# Patient Record
Sex: Female | Born: 1990 | Race: Black or African American | Hispanic: No | Marital: Single | Smoking: Never smoker
Health system: Southern US, Community
[De-identification: ages and names within clinical notes are randomized; demographics above are authoritative.]

## PROBLEM LIST (undated history)

## (undated) HISTORY — PX: IUD REMOVAL: SHX5392

---
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
## 2010-05-03 ENCOUNTER — Emergency Department (HOSPITAL_COMMUNITY)
Admission: EM | Admit: 2010-05-03 | Discharge: 2010-05-03 | Payer: Self-pay | Source: Home / Self Care | Admitting: Family Medicine

## 2010-08-01 ENCOUNTER — Ambulatory Visit: Payer: No Typology Code available for payment source | Attending: Internal Medicine | Admitting: Physical Therapy

## 2010-08-08 ENCOUNTER — Ambulatory Visit: Payer: BC Managed Care – PPO | Attending: Internal Medicine | Admitting: Physical Therapy

## 2010-08-08 DIAGNOSIS — M546 Pain in thoracic spine: Secondary | ICD-10-CM | POA: Insufficient documentation

## 2010-08-08 DIAGNOSIS — R5381 Other malaise: Secondary | ICD-10-CM | POA: Insufficient documentation

## 2010-08-08 DIAGNOSIS — IMO0001 Reserved for inherently not codable concepts without codable children: Secondary | ICD-10-CM | POA: Insufficient documentation

## 2010-08-08 DIAGNOSIS — M545 Low back pain, unspecified: Secondary | ICD-10-CM | POA: Insufficient documentation

## 2010-08-08 DIAGNOSIS — M542 Cervicalgia: Secondary | ICD-10-CM | POA: Insufficient documentation

## 2010-08-10 ENCOUNTER — Ambulatory Visit: Payer: BC Managed Care – PPO | Admitting: Physical Therapy

## 2010-08-16 ENCOUNTER — Ambulatory Visit: Payer: BC Managed Care – PPO | Admitting: Physical Therapy

## 2010-08-18 ENCOUNTER — Ambulatory Visit: Payer: BC Managed Care – PPO | Admitting: Physical Therapy

## 2010-08-19 ENCOUNTER — Ambulatory Visit: Payer: BC Managed Care – PPO | Admitting: Physical Therapy

## 2010-08-22 ENCOUNTER — Ambulatory Visit: Payer: BC Managed Care – PPO | Admitting: Physical Therapy

## 2010-08-24 ENCOUNTER — Ambulatory Visit: Payer: BC Managed Care – PPO | Admitting: Physical Therapy

## 2010-08-26 ENCOUNTER — Ambulatory Visit: Payer: BC Managed Care – PPO | Admitting: Physical Therapy

## 2010-08-29 ENCOUNTER — Ambulatory Visit: Payer: BC Managed Care – PPO | Admitting: Physical Therapy

## 2010-08-31 ENCOUNTER — Ambulatory Visit: Payer: BC Managed Care – PPO | Admitting: Physical Therapy

## 2010-09-02 ENCOUNTER — Ambulatory Visit: Payer: BC Managed Care – PPO | Admitting: Physical Therapy

## 2010-09-06 ENCOUNTER — Ambulatory Visit: Payer: No Typology Code available for payment source | Attending: Internal Medicine | Admitting: Physical Therapy

## 2010-09-06 DIAGNOSIS — M545 Low back pain, unspecified: Secondary | ICD-10-CM | POA: Insufficient documentation

## 2010-09-06 DIAGNOSIS — IMO0001 Reserved for inherently not codable concepts without codable children: Secondary | ICD-10-CM | POA: Insufficient documentation

## 2010-09-06 DIAGNOSIS — R5381 Other malaise: Secondary | ICD-10-CM | POA: Insufficient documentation

## 2010-09-06 DIAGNOSIS — M542 Cervicalgia: Secondary | ICD-10-CM | POA: Insufficient documentation

## 2010-09-06 DIAGNOSIS — M546 Pain in thoracic spine: Secondary | ICD-10-CM | POA: Insufficient documentation

## 2010-09-08 ENCOUNTER — Ambulatory Visit: Payer: No Typology Code available for payment source | Admitting: Rehabilitation

## 2010-09-12 ENCOUNTER — Ambulatory Visit: Payer: No Typology Code available for payment source | Admitting: Rehabilitation

## 2010-09-15 ENCOUNTER — Ambulatory Visit: Payer: No Typology Code available for payment source | Admitting: Rehabilitation

## 2010-09-20 ENCOUNTER — Ambulatory Visit: Payer: No Typology Code available for payment source | Admitting: Physical Therapy

## 2010-09-23 ENCOUNTER — Ambulatory Visit: Payer: No Typology Code available for payment source | Admitting: Rehabilitation

## 2010-09-27 ENCOUNTER — Ambulatory Visit: Payer: No Typology Code available for payment source | Admitting: Rehabilitation

## 2010-09-28 ENCOUNTER — Ambulatory Visit: Payer: No Typology Code available for payment source | Admitting: Rehabilitation

## 2010-10-07 ENCOUNTER — Ambulatory Visit: Payer: No Typology Code available for payment source | Admitting: Physical Therapy

## 2010-10-10 ENCOUNTER — Ambulatory Visit: Payer: No Typology Code available for payment source | Attending: Internal Medicine | Admitting: Rehabilitation

## 2010-10-10 DIAGNOSIS — IMO0001 Reserved for inherently not codable concepts without codable children: Secondary | ICD-10-CM | POA: Insufficient documentation

## 2010-10-10 DIAGNOSIS — M542 Cervicalgia: Secondary | ICD-10-CM | POA: Insufficient documentation

## 2010-10-10 DIAGNOSIS — R5381 Other malaise: Secondary | ICD-10-CM | POA: Insufficient documentation

## 2010-10-10 DIAGNOSIS — M545 Low back pain, unspecified: Secondary | ICD-10-CM | POA: Insufficient documentation

## 2010-10-10 DIAGNOSIS — M546 Pain in thoracic spine: Secondary | ICD-10-CM | POA: Insufficient documentation

## 2010-11-02 ENCOUNTER — Ambulatory Visit: Payer: No Typology Code available for payment source | Admitting: Physical Therapy

## 2010-11-07 ENCOUNTER — Ambulatory Visit: Payer: No Typology Code available for payment source | Attending: Internal Medicine | Admitting: Physical Therapy

## 2010-11-07 DIAGNOSIS — IMO0001 Reserved for inherently not codable concepts without codable children: Secondary | ICD-10-CM | POA: Insufficient documentation

## 2010-11-07 DIAGNOSIS — R293 Abnormal posture: Secondary | ICD-10-CM | POA: Insufficient documentation

## 2010-11-07 DIAGNOSIS — M2550 Pain in unspecified joint: Secondary | ICD-10-CM | POA: Insufficient documentation

## 2010-11-07 DIAGNOSIS — M2569 Stiffness of other specified joint, not elsewhere classified: Secondary | ICD-10-CM | POA: Insufficient documentation

## 2010-11-14 ENCOUNTER — Ambulatory Visit: Payer: No Typology Code available for payment source | Admitting: Physical Therapy

## 2010-11-16 ENCOUNTER — Ambulatory Visit: Payer: No Typology Code available for payment source | Admitting: Rehabilitation

## 2010-11-23 ENCOUNTER — Ambulatory Visit: Payer: No Typology Code available for payment source | Admitting: Rehabilitation

## 2010-12-08 ENCOUNTER — Ambulatory Visit: Payer: No Typology Code available for payment source | Attending: Internal Medicine | Admitting: Physical Therapy

## 2010-12-08 DIAGNOSIS — IMO0001 Reserved for inherently not codable concepts without codable children: Secondary | ICD-10-CM | POA: Insufficient documentation

## 2010-12-08 DIAGNOSIS — M545 Low back pain, unspecified: Secondary | ICD-10-CM | POA: Insufficient documentation

## 2010-12-08 DIAGNOSIS — M542 Cervicalgia: Secondary | ICD-10-CM | POA: Insufficient documentation

## 2010-12-08 DIAGNOSIS — R5381 Other malaise: Secondary | ICD-10-CM | POA: Insufficient documentation

## 2010-12-08 DIAGNOSIS — M546 Pain in thoracic spine: Secondary | ICD-10-CM | POA: Insufficient documentation

## 2011-02-05 ENCOUNTER — Emergency Department (HOSPITAL_BASED_OUTPATIENT_CLINIC_OR_DEPARTMENT_OTHER)
Admission: EM | Admit: 2011-02-05 | Discharge: 2011-02-05 | Disposition: A | Discharge status: Other Acute Inpatient Hospital | Payer: Medicaid Other | Attending: Emergency Medicine | Admitting: Emergency Medicine

## 2011-02-05 DIAGNOSIS — N939 Abnormal uterine and vaginal bleeding, unspecified: Secondary | ICD-10-CM

## 2011-02-05 DIAGNOSIS — N898 Other specified noninflammatory disorders of vagina: Secondary | ICD-10-CM | POA: Insufficient documentation

## 2011-02-05 DIAGNOSIS — R109 Unspecified abdominal pain: Secondary | ICD-10-CM | POA: Insufficient documentation

## 2011-02-05 DIAGNOSIS — Z3201 Encounter for pregnancy test, result positive: Secondary | ICD-10-CM | POA: Insufficient documentation

## 2011-02-05 LAB — URINALYSIS, ROUTINE W REFLEX MICROSCOPIC
Ketones, ur: NEGATIVE mg/dL
Leukocytes, UA: NEGATIVE
Nitrite: NEGATIVE
Protein, ur: NEGATIVE mg/dL

## 2011-02-05 LAB — CBC
MCH: 28 pg (ref 26.0–34.0)
MCV: 83.9 fL (ref 78.0–100.0)
Platelets: 205 10*3/uL (ref 150–400)
RDW: 13.1 % (ref 11.5–15.5)

## 2011-02-05 LAB — URINE MICROSCOPIC-ADD ON

## 2011-02-05 LAB — ABO/RH: ABO/RH(D): O POS

## 2011-02-05 LAB — HCG, QUANTITATIVE, PREGNANCY: hCG, Beta Chain, Quant, S: 2212 m[IU]/mL — ABNORMAL HIGH (ref <5)

## 2011-02-05 LAB — PREGNANCY, URINE: Preg Test, Ur: POSITIVE

## 2011-02-05 NOTE — ED Provider Notes (Signed)
History     CSN: [id number] Arrival date & time: 02/05/2011  3:03 AM  Chief Complaint  Patient presents with  . Abdominal Cramping  . Vaginal Bleeding    (Consider location/radiation/quality/duration/timing/severity/associated sxs/prior treatment) Patient is a 20 y.o. female presenting with cramps and vaginal bleeding. The history is provided by the patient.  Abdominal Cramping The primary symptoms of the illness include abdominal pain and vaginal bleeding. The primary symptoms of the illness do not include nausea, vomiting, diarrhea or vaginal discharge. Episode onset: today.  Vaginal Bleeding Associated symptoms include abdominal pain.   is reports development of heavy vaginal bleeding.  She denies fever or chills.  She denies dysuria. G3P2A1.  She reports that her pregnancy test was positive.  Nothing improves or worsens her symptoms.  She has had no lightheadedness or chest pain or shortness of breath.  She's had no syncope.  She denies melena and hematochezia  History reviewed. No pertinent past medical history.  Past Surgical History  Procedure Date  . Cesarean section   . Iud removal     History reviewed. No pertinent family history.  History  Substance Use Topics  . Smoking status: Never Smoker   . Smokeless tobacco: Never Used  . Alcohol Use: Yes     occasional use, last used 02/03/2011    OB History    Grav Para Term Preterm Abortions TAB SAB Ect Mult Living                  Review of Systems  Gastrointestinal: Positive for abdominal pain. Negative for nausea, vomiting and diarrhea.  Genitourinary: Positive for vaginal bleeding. Negative for vaginal discharge.  All other systems reviewed and are negative.    Allergies  Review of patient's allergies indicates no known allergies.  Home Medications  No current outpatient prescriptions on file.  BP 106/58  Pulse 73  Temp(Src) 98.4 F (36.9 C) (Oral)  Resp 16  Ht 5\' 6"  (1.676 m)  Wt 170 lb (77.111 kg)   BMI 27.44 kg/m2  SpO2 100%  LMP 01/05/2011  Physical Exam  Nursing note and vitals reviewed. Constitutional: She is oriented to person, place, and time. She appears well-developed and well-nourished. No distress.  HENT:  Head: Normocephalic and atraumatic.  Eyes: EOM are normal.  Neck: Normal range of motion.  Cardiovascular: Normal rate, regular rhythm and normal heart sounds.   Pulmonary/Chest: Effort normal and breath sounds normal.  Abdominal: Soft. She exhibits no distension. There is no tenderness.  Genitourinary:       Normal appearing cervix with a small amount of blood coming from it.  No clots in the vagina.  No adnexal masses or tenderness.  External genitalia are normal  Musculoskeletal: Normal range of motion.  Neurological: She is alert and oriented to person, place, and time.  Skin: Skin is warm and dry.  Psychiatric: She has a normal mood and affect. Judgment normal.    ED Course  Procedures (including critical care time)  Labs Reviewed  URINALYSIS, ROUTINE W REFLEX MICROSCOPIC - Abnormal; Notable for the following:    Hgb urine dipstick SMALL (*)    All other components within normal limits  WET PREP, GENITAL - Abnormal; Notable for the following:    Clue Cells, Wet Prep FEW (*)    WBC, Wet Prep HPF POC MODERATE (*)    All other components within normal limits  HCG, QUANTITATIVE, PREGNANCY - Abnormal; Notable for the following:    hCG, Beta Chain, Quant, S 2212 (*)  All other components within normal limits  PREGNANCY, URINE  URINE MICROSCOPIC-ADD ON  CBC  GC/CHLAMYDIA PROBE AMP, GENITAL  ABO/RH   No results found.   1. Abdominal pain   2. Pregnancy test-positive   3. Vaginal bleeding       MDM  3:39 AM Obtain urine and urine pregnancy.  Pelvic exam to be completed.  Patient with normal vital signs and well appearing.  Last normal menstrual period 01/05/2011  4:16 AM Urine pregnancy test is positive.  We'll obtain Rh and quantitative  hCG  5:24 AM Attempted transvaginal ultrasound at bedside.  Unable to locate a gestational sac or intrauterine pregnancy.  Concern for possible mass in the right adnexa.  Patient will require formal ultrasound.  I spoke with Dr. Rana Snare from [facility] who accepts the patient in transfer to the [facility] emergency department for official ultrasound. Spoke with the ER physician who will expect the pt        Lyanne Co, MD 02/05/11 [phone]

## 2011-02-05 NOTE — ED Notes (Signed)
MD at bedside performing pelvic exam.

## 2011-02-05 NOTE — ED Notes (Signed)
Pt states that she had onset of abdominal cramping and vaginal bleeding about 45 minutes prior to her arrival here.  Pt states that she believes that she may be pregnant.  LMP 8/30

## 2011-02-05 NOTE — ED Notes (Signed)
I placed a call to [facility] for the MD on call for Dr. Blanch Media, call returned Dr. Rana Snare.

## 2011-07-29 ENCOUNTER — Encounter (HOSPITAL_BASED_OUTPATIENT_CLINIC_OR_DEPARTMENT_OTHER): Payer: Self-pay | Admitting: *Deleted

## 2011-07-29 ENCOUNTER — Emergency Department (HOSPITAL_BASED_OUTPATIENT_CLINIC_OR_DEPARTMENT_OTHER)
Admission: EM | Admit: 2011-07-29 | Discharge: 2011-07-29 | Disposition: A | Discharge status: Other Acute Inpatient Hospital | Payer: Medicaid Other | Attending: Emergency Medicine | Admitting: Emergency Medicine

## 2011-07-29 DIAGNOSIS — O26899 Other specified pregnancy related conditions, unspecified trimester: Secondary | ICD-10-CM

## 2011-07-29 DIAGNOSIS — O209 Hemorrhage in early pregnancy, unspecified: Secondary | ICD-10-CM | POA: Insufficient documentation

## 2011-07-29 DIAGNOSIS — B9689 Other specified bacterial agents as the cause of diseases classified elsewhere: Secondary | ICD-10-CM | POA: Insufficient documentation

## 2011-07-29 DIAGNOSIS — A499 Bacterial infection, unspecified: Secondary | ICD-10-CM | POA: Insufficient documentation

## 2011-07-29 DIAGNOSIS — N760 Acute vaginitis: Secondary | ICD-10-CM | POA: Insufficient documentation

## 2011-07-29 DIAGNOSIS — O99891 Other specified diseases and conditions complicating pregnancy: Secondary | ICD-10-CM | POA: Insufficient documentation

## 2011-07-29 DIAGNOSIS — O2390 Unspecified genitourinary tract infection in pregnancy, unspecified trimester: Secondary | ICD-10-CM | POA: Insufficient documentation

## 2011-07-29 DIAGNOSIS — R109 Unspecified abdominal pain: Secondary | ICD-10-CM | POA: Insufficient documentation

## 2011-07-29 LAB — URINALYSIS, ROUTINE W REFLEX MICROSCOPIC
Bilirubin Urine: NEGATIVE
Ketones, ur: NEGATIVE mg/dL
Nitrite: NEGATIVE
pH: 6 (ref 5.0–8.0)

## 2011-07-29 LAB — BASIC METABOLIC PANEL
BUN: 10 mg/dL (ref 6–23)
CO2: 26 mEq/L (ref 19–32)
Calcium: 9.6 mg/dL (ref 8.4–10.5)
Creatinine, Ser: 0.8 mg/dL (ref 0.50–1.10)
Glucose, Bld: 82 mg/dL (ref 70–99)

## 2011-07-29 LAB — CBC
HCT: 41.8 % (ref 36.0–46.0)
MCH: 28 pg (ref 26.0–34.0)
MCV: 82.3 fL (ref 78.0–100.0)
Platelets: 225 10*3/uL (ref 150–400)
RBC: 5.08 MIL/uL (ref 3.87–5.11)

## 2011-07-29 LAB — WET PREP, GENITAL
Trich, Wet Prep: NONE SEEN
Yeast Wet Prep HPF POC: NONE SEEN

## 2011-07-29 LAB — RH IG WORKUP (INCLUDES ABO/RH): Gestational Age(Wks): 5

## 2011-07-29 MED ORDER — METRONIDAZOLE 500 MG PO TABS
2000.0000 mg | ORAL_TABLET | Freq: Once | ORAL | Status: AC
  Administered 2011-07-29: 2000 mg via ORAL
  Filled 2011-07-29: qty 4

## 2011-07-29 NOTE — ED Notes (Signed)
I placed a call for consult to [facility], the call was returned by Dr. Altamese [place].

## 2011-07-29 NOTE — ED Notes (Signed)
Pt transferred to [facility] by [facility] for Ultrasound- alert- denies pain-

## 2011-07-29 NOTE — ED Notes (Signed)
Pt states she is approx 5-[redacted] weeks pregnant, but has not seen the Dr. Today noticed some spotting and abd cramping. Pain is worse on the left "like my left ovary is hurting"

## 2011-07-29 NOTE — ED Notes (Signed)
Patient ambulates to bathroom without difficulty and with no assistance

## 2011-07-29 NOTE — ED Provider Notes (Signed)
History     CSN: [id number]  Arrival date & time 07/29/11  1653   First MD Initiated Contact with Patient 07/29/11 1721      Chief Complaint  Patient presents with  . Abdominal Pain    (Consider location/radiation/quality/duration/timing/severity/associated sxs/prior treatment) HPI Comments: Pt states that she noticed bright red blood with wiping throughout the day today:pt states that her lmp was feb11:pt is g5 para4(twins):2 elective abortions:pt has not seen her obgyn yet:pt states that she is having left sided pain  Patient is a 21 y.o. female presenting with abdominal pain. The history is provided by the patient. No language interpreter was used.  Abdominal Pain The primary symptoms of the illness include abdominal pain and vaginal bleeding. The primary symptoms of the illness do not include fever, nausea, vomiting or dysuria. The current episode started 6 to 12 hours ago. The problem has not changed since onset. The patient states that she believes she is currently pregnant. Symptoms associated with the illness do not include back pain.    History reviewed. No pertinent past medical history.  Past Surgical History  Procedure Date  . Cesarean section   . Iud removal     History reviewed. No pertinent family history.  History  Substance Use Topics  . Smoking status: Never Smoker   . Smokeless tobacco: Never Used  . Alcohol Use: Yes     occasional use, last used 02/03/2011    OB History    Grav Para Term Preterm Abortions TAB SAB Ect Mult Living   6 3   2            Review of Systems  Constitutional: Negative.  Negative for fever.  Respiratory: Negative.   Cardiovascular: Negative.   Gastrointestinal: Positive for abdominal pain. Negative for nausea and vomiting.  Genitourinary: Positive for vaginal bleeding. Negative for dysuria.  Musculoskeletal: Negative for back pain.  Neurological: Negative.   Psychiatric/Behavioral: Negative.     Allergies  Review of  patient's allergies indicates no known allergies.  Home Medications  No current outpatient prescriptions on file.  BP 98/59  Pulse 82  Temp(Src) 98.2 F (36.8 C) (Oral)  Resp 18  Ht 5\' 6"  (1.676 m)  Wt 175 lb (79.379 kg)  BMI 28.25 kg/m2  SpO2 100%  LMP 06/19/2011  Breastfeeding? Unknown  Physical Exam  Nursing note and vitals reviewed. Constitutional: She is oriented to person, place, and time. She appears well-developed and well-nourished.  HENT:  Head: Normocephalic and atraumatic.  Eyes: Conjunctivae and EOM are normal.  Neck: Neck supple.  Cardiovascular: Normal rate and regular rhythm.   Pulmonary/Chest: Effort normal and breath sounds normal.  Abdominal: Soft. Bowel sounds are normal. There is no tenderness.  Genitourinary: Cervix exhibits no motion tenderness. Right adnexum displays no tenderness. Left adnexum displays no tenderness.       Brown vaginal discharge without any bleeding  Musculoskeletal: Normal range of motion.  Neurological: She is alert and oriented to person, place, and time.  Skin: Skin is warm and dry.  Psychiatric: She has a normal mood and affect.    ED Course  Procedures (including critical care time)  Labs Reviewed  PREGNANCY, URINE - Abnormal; Notable for the following:    Preg Test, Ur POSITIVE (*)    All other components within normal limits  HCG, QUANTITATIVE, PREGNANCY - Abnormal; Notable for the following:    hCG, Beta Chain, Sharene Butters, [place] [address] (*)    All other components within normal limits  WET PREP,  GENITAL - Abnormal; Notable for the following:    Clue Cells Wet Prep HPF POC TOO NUMEROUS TO COUNT (*)    WBC, Wet Prep HPF POC TOO NUMEROUS TO COUNT (*)    All other components within normal limits  URINALYSIS, ROUTINE W REFLEX MICROSCOPIC  CBC  BASIC METABOLIC PANEL  RH IG WORKUP  GC/CHLAMYDIA PROBE AMP, GENITAL   No results found.   1. Abdominal pain in pregnancy   2. BV (bacterial vaginosis)       MDM  Spoke with  Dr. Altamese [place] with ob and pt to go to [facility] for ultrasound to rule out ectopic:type and rh obtained although not yet known:pt to be transferred shortly        Teressa Lower, NP 07/29/11 1927  Teressa Lower, NP 07/29/11 1952

## 2011-07-30 NOTE — ED Provider Notes (Signed)
Medical screening examination/treatment/procedure(s) were performed by non-physician practitioner and as supervising physician I was immediately available for consultation/collaboration.  Toy Baker, MD 07/30/11 [phone]

## 2011-07-31 LAB — GC/CHLAMYDIA PROBE AMP, GENITAL
Chlamydia, DNA Probe: NEGATIVE
GC Probe Amp, Genital: NEGATIVE

## 2012-01-06 ENCOUNTER — Encounter (HOSPITAL_BASED_OUTPATIENT_CLINIC_OR_DEPARTMENT_OTHER): Payer: Self-pay | Admitting: Emergency Medicine

## 2012-01-06 ENCOUNTER — Emergency Department (HOSPITAL_BASED_OUTPATIENT_CLINIC_OR_DEPARTMENT_OTHER)
Admission: EM | Admit: 2012-01-06 | Discharge: 2012-01-06 | Disposition: A | Discharge status: Home / Self Care | Payer: BC Managed Care – PPO | Attending: Emergency Medicine | Admitting: Emergency Medicine

## 2012-01-06 DIAGNOSIS — J020 Streptococcal pharyngitis: Secondary | ICD-10-CM | POA: Insufficient documentation

## 2012-01-06 MED ORDER — IBUPROFEN 800 MG PO TABS
800.0000 mg | ORAL_TABLET | Freq: Once | ORAL | Status: AC
  Administered 2012-01-06: 800 mg via ORAL

## 2012-01-06 MED ORDER — PENICILLIN V POTASSIUM 500 MG PO TABS: 500.0000 mg | ORAL_TABLET | Freq: Three times a day (TID) | ORAL | Status: AC

## 2012-01-06 MED ORDER — IBUPROFEN 400 MG PO TABS
ORAL_TABLET | ORAL | Status: AC
  Administered 2012-01-06: 800 mg via ORAL
  Filled 2012-01-06: qty 2

## 2012-01-06 NOTE — ED Notes (Signed)
Pt c/o sore throat and fever since yesterday. 

## 2012-01-06 NOTE — ED Provider Notes (Addendum)
History     CSN: [id number]  Arrival date & time 01/06/12  8295   First MD Initiated Contact with Patient 01/06/12 1004      Chief Complaint  Patient presents with  . Sore Throat    (Consider location/radiation/quality/duration/timing/severity/associated sxs/prior treatment) HPI Patient complaining of sore throat that began yesterday. She has also had fever. She describes diffuse muscle aches. She has not noted any rash. She took Tylenol last night with some relief of symptoms. Her brother has recently had strep throat. She has not noted any discharge from her throat. She has been taking by mouth well. She does not have any cough, nausea, vomiting, or diarrhea.  No past medical history on file.  Past Surgical History  Procedure Date  . Cesarean section   . Iud removal     No family history on file.  History  Substance Use Topics  . Smoking status: Never Smoker   . Smokeless tobacco: Never Used  . Alcohol Use: Yes     occasional use, last used 02/03/2011    OB History    Grav Para Term Preterm Abortions TAB SAB Ect Mult Living   6 3   2            Review of Systems  Constitutional: Negative for fever, chills, activity change, appetite change and unexpected weight change.  HENT: Negative for sore throat, rhinorrhea, neck pain, neck stiffness and sinus pressure.   Eyes: Negative for visual disturbance.  Respiratory: Negative for cough and shortness of breath.   Cardiovascular: Negative for chest pain and leg swelling.  Gastrointestinal: Negative for vomiting, abdominal pain, diarrhea and blood in stool.  Genitourinary: Negative for dysuria, urgency, frequency, vaginal discharge and difficulty urinating.  Musculoskeletal: Negative for myalgias, arthralgias and gait problem.  Skin: Negative for color change and rash.  Neurological: Negative for weakness, light-headedness and headaches.  Hematological: Does not bruise/bleed easily.  Psychiatric/Behavioral: Negative for  dysphoric mood.    Allergies  Review of patient's allergies indicates no known allergies.  Home Medications   Current Outpatient Rx  Name Route Sig Dispense Refill  . SPRINTEC 28 PO Oral Take by mouth daily.      BP 93/59  Pulse 125  Temp 101.3 F (38.5 C) (Oral)  Resp 22  SpO2 100%  LMP 12/19/2011  Breastfeeding? No  Physical Exam  Nursing note and vitals reviewed. Constitutional: She is oriented to person, place, and time. She appears well-developed and well-nourished.  HENT:  Head: Normocephalic and atraumatic.  Right Ear: External ear normal.  Left Ear: External ear normal.  Nose: Nose normal.  Mouth/Throat: Oropharynx is clear and moist.  Eyes: Conjunctivae and EOM are normal. Pupils are equal, round, and reactive to light.  Neck: Normal range of motion. Neck supple.  Cardiovascular: Normal rate.   Pulmonary/Chest: Effort normal and breath sounds normal.  Abdominal: Soft. Bowel sounds are normal.  Musculoskeletal: Normal range of motion.  Neurological: She is alert and oriented to person, place, and time. She has normal reflexes.  Skin: Skin is warm and dry.  Psychiatric: She has a normal mood and affect.    ED Course  Procedures (including critical care time)  Labs Reviewed  RAPID STREP SCREEN - Abnormal; Notable for the following:    Streptococcus, Group A Screen (Direct) POSITIVE (*)     All other components within normal limits   No results found.   No diagnosis found.    MDM   Nurses notes show tachycardia but my  exam revealed heart rate in the 90s. Temp pressure is elevated 11.3 and patient advised on antipyretic treatment and by mouth intake.       Hilario Quarry, MD 01/06/12 1204  Hilario Quarry, MD 01/06/12 1452

## 2012-02-24 ENCOUNTER — Emergency Department (HOSPITAL_BASED_OUTPATIENT_CLINIC_OR_DEPARTMENT_OTHER): Payer: BC Managed Care – PPO

## 2012-02-24 ENCOUNTER — Encounter (HOSPITAL_BASED_OUTPATIENT_CLINIC_OR_DEPARTMENT_OTHER): Payer: Self-pay | Admitting: Emergency Medicine

## 2012-02-24 ENCOUNTER — Emergency Department (HOSPITAL_BASED_OUTPATIENT_CLINIC_OR_DEPARTMENT_OTHER)
Admission: EM | Admit: 2012-02-24 | Discharge: 2012-02-24 | Disposition: A | Discharge status: Home / Self Care | Payer: BC Managed Care – PPO | Attending: Emergency Medicine | Admitting: Emergency Medicine

## 2012-02-24 DIAGNOSIS — J40 Bronchitis, not specified as acute or chronic: Secondary | ICD-10-CM

## 2012-02-24 DIAGNOSIS — R0602 Shortness of breath: Secondary | ICD-10-CM | POA: Insufficient documentation

## 2012-02-24 DIAGNOSIS — R079 Chest pain, unspecified: Secondary | ICD-10-CM | POA: Insufficient documentation

## 2012-02-24 MED ORDER — NAPROXEN 500 MG PO TABS: 500.0000 mg | ORAL_TABLET | Freq: Two times a day (BID) | ORAL | Status: DC

## 2012-02-24 MED ORDER — ALBUTEROL SULFATE HFA 108 (90 BASE) MCG/ACT IN AERS: 2.0000 | INHALATION_SPRAY | RESPIRATORY_TRACT | Status: DC | PRN

## 2012-02-24 MED ORDER — ACETAMINOPHEN 325 MG PO TABS
650.0000 mg | ORAL_TABLET | Freq: Once | ORAL | Status: AC
  Administered 2012-02-24: 650 mg via ORAL
  Filled 2012-02-24: qty 2

## 2012-02-24 MED ORDER — KETOROLAC TROMETHAMINE 30 MG/ML IJ SOLN
30.0000 mg | Freq: Once | INTRAMUSCULAR | Status: AC
  Administered 2012-02-24: 30 mg via INTRAVENOUS
  Filled 2012-02-24: qty 1

## 2012-02-24 MED ORDER — IOHEXOL 350 MG/ML SOLN
80.0000 mL | Freq: Once | INTRAVENOUS | Status: AC | PRN
  Administered 2012-02-24: 80 mL via INTRAVENOUS

## 2012-02-24 NOTE — ED Notes (Signed)
Pt c/o cough, chest congestion, chest pain and shortness of breath. NAD noted. Pt states she took a breathing treatment x 1 hour ago and took tylenol yesterday morning.

## 2012-02-24 NOTE — ED Notes (Signed)
Patient transported to CT 

## 2012-02-24 NOTE — ED Provider Notes (Signed)
History     CSN: [id number]  Arrival date & time 02/24/12  0125   First MD Initiated Contact with Patient 02/24/12 0148      Chief Complaint  Patient presents with  . Cough  . Chest Pain  . Shortness of Breath    (Consider location/radiation/quality/duration/timing/severity/associated sxs/prior treatment) HPI Comments: 21 year old female with a history of no significant medical problems presents with a complaint of chest pain and shortness of breath. She states that yesterday she developed a cough and today she developed chest pain shortness of breath which has been persistent most of the day. She admits to having chronic pain in her left knee and left hip but has not had any focal swelling or asymmetry. She uses oral contraceptives and has recently been a long plane ride where she has been immobilized for upwards of 12 hours. 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 She has no history of cancer nor did she have a history or family history of thromboembolism.   Patient is a 21 y.o. female presenting with cough, chest pain, and shortness of breath. The history is provided by the patient and the spouse.  Cough Associated symptoms include chest pain and shortness of breath.  Chest Pain Primary symptoms include shortness of breath and cough.    Shortness of Breath  Associated symptoms include chest pain, cough and shortness of breath.    History reviewed. No pertinent past medical history.  Past Surgical History  Procedure Date  . Cesarean section   . Iud removal     No family history on file.  History  Substance Use Topics  . Smoking status: Never Smoker   . Smokeless tobacco: Never Used  . Alcohol Use: Yes     occasional use, last used 02/03/2011    OB History    Grav Para Term Preterm Abortions TAB SAB Ect Mult Living   6 3   2            Review of Systems  Respiratory:  Positive for cough and shortness of breath.   Cardiovascular: Positive for chest pain.  All other systems reviewed and are negative.    Allergies  Review of patient's allergies indicates no known allergies.  Home Medications   Current Outpatient Rx  Name Route Sig Dispense Refill  . ALBUTEROL SULFATE 1.25 MG/3ML IN NEBU Nebulization Take 1 ampule by nebulization as needed.    . ALBUTEROL SULFATE HFA 108 (90 BASE) MCG/ACT IN AERS Inhalation Inhale 2 puffs into the lungs every 4 (four) hours as needed for wheezing or shortness of breath. 1 Inhaler 3  . NAPROXEN 500 MG PO TABS Oral Take 1 tablet (500 mg total) by mouth 2 (two) times daily with a meal. 30 tablet 0  . SPRINTEC 28 PO Oral Take by mouth daily.      BP 108/66  Pulse 120  Temp 100.2 F (37.9 C) (Oral)  Resp 18  Ht 5\' 6"  (1.676 m)  Wt 176 lb (79.833 kg)  BMI 28.41 kg/m2  SpO2 98%  LMP 02/10/2012  Physical Exam  Nursing note and vitals reviewed. Constitutional: She appears well-developed and well-nourished. No distress.  HENT:  Head: Normocephalic and atraumatic.  Mouth/Throat: Oropharynx is clear and moist. No oropharyngeal exudate.  Eyes: Conjunctivae normal and EOM are normal. Pupils are equal, round, and reactive to light. Right eye exhibits no discharge. Left eye exhibits no  discharge. No scleral icterus.  Neck: Normal range of motion. Neck supple. No JVD present. No thyromegaly present.  Cardiovascular: Regular rhythm, normal heart sounds and intact distal pulses.  Exam reveals no gallop and no friction rub.   No murmur heard.      Tachycardic to 120  Pulmonary/Chest: Effort normal and breath sounds normal. No respiratory distress. She has no wheezes. She has no rales.  Abdominal: Soft. Bowel sounds are normal. She exhibits no distension and no mass. There is no tenderness.  Musculoskeletal: Normal range of motion. She exhibits no edema and no tenderness.  Lymphadenopathy:    She has no cervical adenopathy.    Neurological: She is alert. Coordination normal.  Skin: Skin is warm and dry. No rash noted. No erythema.  Psychiatric: She has a normal mood and affect. Her behavior is normal.    ED Course  Procedures (including critical care time)  Labs Reviewed - No data to display Dg Chest 2 View  02/24/2012  *RADIOLOGY REPORT*  Clinical Data: Cough, chest pain, shortness of breath.  CHEST - 2 VIEW  Comparison: None.  Findings: The heart size and pulmonary vascularity are normal. The lungs appear clear and expanded without focal air space disease or consolidation. No blunting of the costophrenic angles.  No pneumothorax.  Mediastinal contours appear intact.  IMPRESSION: No evidence of active pulmonary disease.   Original Report Authenticated By: Marlon Pel, M.D.    Ct Angio Chest Pe W/cm &/or Wo Cm  02/24/2012  *RADIOLOGY REPORT*  Clinical Data: Cough, chest congestion, chest pain and shortness of breath.  CT ANGIOGRAPHY CHEST  Technique:  Multidetector CT imaging of the chest using the standard protocol during bolus administration of intravenous contrast. Multiplanar reconstructed images including MIPs were obtained and reviewed to evaluate the vascular anatomy.  Contrast: 80mL OMNIPAQUE IOHEXOL 350 MG/ML SOLN  Comparison: None.  Findings: Technically adequate study with good opacification of the central and segmental pulmonary arteries.  No focal filling defects.  No evidence of significant pulmonary embolus.  Normal heart size.  Normal caliber thoracic aorta.  Mild soft tissue changes in the anterior mediastinum likely representing residual thymus.  No abnormal mediastinal fluid collections.  The esophagus is decompressed.  Visualized portions of the upper abdominal organs are unremarkable for technique.  Mediastinal and hilar lymph nodes are not pathologically enlarged.  No pleural effusions.  No focal airspace consolidation or interstitial changes in the lungs.  No pneumothorax.  Airways appear  patent.  Normal alignment of the thoracic spine.  IMPRESSION: No evidence of significant pulmonary embolus.   Original Report Authenticated By: Marlon Pel, M.D.      1. Bronchitis    #2 chest pain #3 shortness of breath    MDM  The patient's pulmonary exam is normal, her cardiac exam shows tachycardia and she has a borderline fever of 100.2. Given her recent immobilization and use of oral contraceptives I must rule out pulmonary embolism with a CT angiogram. Chest x-ray does not reveal any focal infiltrates  The CAT scan has been reviewed by myself in by the radiologist, there is no signs of pulmonary embolism nor is there any sign of pneumothorax or focal airspace disease. I suspect that with the patient's borderline fever, pleuritic chest pain and cough that she is developing a bronchitis, and do not hear any abnormal heart sounds to suggest pericarditis. Tachycardia has resolved the symptoms have improved after medications. Patient aware of findings and will followup with family doctor  On discharge vital signs the patient still has a slight tachycardia, I suspect this is related to her underlying illness as there is no pulmonary embolism. She feels comfortable going home, remaining hydrated, using medications for fevers and pain and following up should her symptoms worsen.  Discharge Prescriptions include:  Naprosyn Albuterol MDI      Vida Roller, MD 02/24/12 [phone]

## 2012-12-14 ENCOUNTER — Encounter (HOSPITAL_BASED_OUTPATIENT_CLINIC_OR_DEPARTMENT_OTHER): Payer: Self-pay | Admitting: Emergency Medicine

## 2012-12-14 ENCOUNTER — Emergency Department (HOSPITAL_BASED_OUTPATIENT_CLINIC_OR_DEPARTMENT_OTHER)
Admission: EM | Admit: 2012-12-14 | Discharge: 2012-12-14 | Discharge status: Against Medical Advice | Payer: Medicaid Other | Attending: Emergency Medicine | Admitting: Emergency Medicine

## 2012-12-14 DIAGNOSIS — K089 Disorder of teeth and supporting structures, unspecified: Secondary | ICD-10-CM | POA: Insufficient documentation

## 2012-12-14 NOTE — ED Notes (Signed)
Registration staff stated patient left without signing ama papers.

## 2012-12-14 NOTE — ED Notes (Signed)
C/o cold symptoms, states feels left side of face is swollen and painful, c/o tooth pain

## 2013-03-03 ENCOUNTER — Encounter (HOSPITAL_BASED_OUTPATIENT_CLINIC_OR_DEPARTMENT_OTHER): Payer: Self-pay | Admitting: Emergency Medicine

## 2013-03-03 ENCOUNTER — Emergency Department (HOSPITAL_BASED_OUTPATIENT_CLINIC_OR_DEPARTMENT_OTHER)
Admission: EM | Admit: 2013-03-03 | Discharge: 2013-03-03 | Discharge status: Against Medical Advice | Payer: BC Managed Care – PPO | Attending: Emergency Medicine | Admitting: Emergency Medicine

## 2013-03-03 DIAGNOSIS — M25569 Pain in unspecified knee: Secondary | ICD-10-CM | POA: Insufficient documentation

## 2013-03-03 NOTE — ED Notes (Addendum)
Right knee pain. Hx of chronic pain in her knees. Drove herself here.

## 2013-04-09 ENCOUNTER — Emergency Department (HOSPITAL_BASED_OUTPATIENT_CLINIC_OR_DEPARTMENT_OTHER)
Admission: EM | Admit: 2013-04-09 | Discharge: 2013-04-09 | Disposition: A | Discharge status: Home / Self Care | Payer: BC Managed Care – PPO | Attending: Emergency Medicine | Admitting: Emergency Medicine

## 2013-04-09 ENCOUNTER — Emergency Department (HOSPITAL_BASED_OUTPATIENT_CLINIC_OR_DEPARTMENT_OTHER): Payer: Self-pay

## 2013-04-09 ENCOUNTER — Encounter (HOSPITAL_BASED_OUTPATIENT_CLINIC_OR_DEPARTMENT_OTHER): Payer: Self-pay | Admitting: Emergency Medicine

## 2013-04-09 DIAGNOSIS — M25461 Effusion, right knee: Secondary | ICD-10-CM

## 2013-04-09 DIAGNOSIS — Z79899 Other long term (current) drug therapy: Secondary | ICD-10-CM | POA: Insufficient documentation

## 2013-04-09 DIAGNOSIS — Z791 Long term (current) use of non-steroidal anti-inflammatories (NSAID): Secondary | ICD-10-CM | POA: Insufficient documentation

## 2013-04-09 DIAGNOSIS — M25469 Effusion, unspecified knee: Secondary | ICD-10-CM | POA: Insufficient documentation

## 2013-04-09 MED ORDER — HYDROCODONE-ACETAMINOPHEN 5-325 MG PO TABS: 1.0000 | ORAL_TABLET | Freq: Four times a day (QID) | ORAL | Status: AC | PRN
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
## 2013-04-09 NOTE — ED Notes (Signed)
Pt c/o right knee pain w/o injury x 4 days 

## 2013-04-14 ENCOUNTER — Encounter: Payer: Self-pay | Admitting: Family Medicine

## 2013-04-14 ENCOUNTER — Ambulatory Visit (INDEPENDENT_AMBULATORY_CARE_PROVIDER_SITE_OTHER): Payer: Self-pay | Admitting: Family Medicine

## 2013-04-14 VITALS — BP 111/74 | HR 75 | Ht 66.0 in | Wt 179.0 lb

## 2013-04-14 DIAGNOSIS — M25569 Pain in unspecified knee: Secondary | ICD-10-CM

## 2013-04-14 DIAGNOSIS — M25561 Pain in right knee: Secondary | ICD-10-CM

## 2013-04-14 NOTE — Patient Instructions (Signed)
I'm concerned you have a meniscus tear in your knee. You were given a cortisone shot today - this is most likely to give you the quickest, longest lasting relief. Take aleve 2 tabs twice a day with food for pain and inflammation. Straight leg raises, leg raises with foot turned outwards, and side hip raises 3 sets of 10 once a day. Add ankle weight if these become too easy. Icing 15 minutes at a time 3-4 times a day (after exercise, and if painful or really swollen). Elevation, compression with ace wrap as needed for swelling. Consider formal physical therapy, MRI if not improving. Follow up with me in 6 weeks for reevaluation.

## 2013-04-15 ENCOUNTER — Encounter: Payer: Self-pay | Admitting: Family Medicine

## 2013-04-15 DIAGNOSIS — M25561 Pain in right knee: Secondary | ICD-10-CM | POA: Insufficient documentation
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
## 2013-05-26 ENCOUNTER — Ambulatory Visit: Payer: Medicaid Other | Admitting: Family Medicine

## 2013-05-29 ENCOUNTER — Ambulatory Visit: Payer: Medicaid Other | Admitting: Family Medicine

## 2013-12-22 ENCOUNTER — Encounter (HOSPITAL_BASED_OUTPATIENT_CLINIC_OR_DEPARTMENT_OTHER): Payer: Self-pay | Admitting: Emergency Medicine

## 2013-12-22 DIAGNOSIS — N898 Other specified noninflammatory disorders of vagina: Secondary | ICD-10-CM | POA: Insufficient documentation

## 2013-12-22 DIAGNOSIS — O9989 Other specified diseases and conditions complicating pregnancy, childbirth and the puerperium: Secondary | ICD-10-CM | POA: Insufficient documentation

## 2013-12-22 DIAGNOSIS — R109 Unspecified abdominal pain: Secondary | ICD-10-CM | POA: Insufficient documentation

## 2013-12-22 DIAGNOSIS — Z9889 Other specified postprocedural states: Secondary | ICD-10-CM | POA: Insufficient documentation

## 2013-12-22 DIAGNOSIS — Z79899 Other long term (current) drug therapy: Secondary | ICD-10-CM | POA: Insufficient documentation

## 2013-12-22 LAB — URINALYSIS, ROUTINE W REFLEX MICROSCOPIC
BILIRUBIN URINE: NEGATIVE
Glucose, UA: NEGATIVE mg/dL
HGB URINE DIPSTICK: NEGATIVE
KETONES UR: 15 mg/dL — AB
Leukocytes, UA: NEGATIVE
NITRITE: NEGATIVE
PH: 5.5 (ref 5.0–8.0)
Protein, ur: NEGATIVE mg/dL
SPECIFIC GRAVITY, URINE: 1.036 — AB (ref 1.005–1.030)
Urobilinogen, UA: 1 mg/dL (ref 0.0–1.0)

## 2013-12-22 LAB — PREGNANCY, URINE: Preg Test, Ur: POSITIVE — AB

## 2013-12-22 NOTE — ED Notes (Signed)
Pt c/o lower abd pain ? preg

## 2013-12-23 ENCOUNTER — Emergency Department (HOSPITAL_BASED_OUTPATIENT_CLINIC_OR_DEPARTMENT_OTHER)
Admission: EM | Admit: 2013-12-23 | Discharge: 2013-12-23 | Disposition: A | Discharge status: Home / Self Care | Payer: Medicaid Other | Attending: Emergency Medicine | Admitting: Emergency Medicine

## 2013-12-23 ENCOUNTER — Encounter (HOSPITAL_BASED_OUTPATIENT_CLINIC_OR_DEPARTMENT_OTHER): Payer: Self-pay | Admitting: Emergency Medicine

## 2013-12-23 DIAGNOSIS — Z3490 Encounter for supervision of normal pregnancy, unspecified, unspecified trimester: Secondary | ICD-10-CM

## 2013-12-23 LAB — WET PREP, GENITAL
TRICH WET PREP: NONE SEEN
YEAST WET PREP: NONE SEEN

## 2013-12-23 MED ORDER — ACETAMINOPHEN 500 MG PO TABS
1000.0000 mg | ORAL_TABLET | Freq: Once | ORAL | Status: AC
  Administered 2013-12-23: 1000 mg via ORAL
  Filled 2013-12-23: qty 2

## 2013-12-23 NOTE — ED Provider Notes (Signed)
CSN: [id number]     Arrival date & time 12/22/13  2337 History  This chart was scribed for Alexandria Micco Smitty Cords, Alexandria Frey by Alexandria Frey, ED Scribe. The patient was seen in room MH03/MH03. Patient's care was started at 12:35 AM.   Chief Complaint  Patient presents with  . Abdominal Pain   Patient is a 23 y.o. female presenting with abdominal pain. The history is provided by the patient. No language interpreter was used.  Abdominal Pain Pain quality: cramping   Pain radiates to:  Does not radiate Pain severity:  Mild Duration:  1 week Chronicity:  New Associated symptoms: no vaginal bleeding   Risk factors: pregnancy    HPI Comments: Alexandria Frey is a 23 y.o. female who presents to the Emergency Department complaining of intermittent lower abdominal pain onset 1 week ago. Pt suspects possible pregnancy. She denies vaginal bleeding. LNMP 11/13/13. G5, P3.   History reviewed. No pertinent past medical history. Past Surgical History  Procedure Laterality Date  . Cesarean section    . Iud removal     Family History  Problem Relation Age of Onset  . Sudden death Neg Hx   . Hypertension Neg Hx   . Hyperlipidemia Neg Hx   . Heart attack Neg Hx   . Diabetes Neg Hx    History  Substance Use Topics  . Smoking status: Never Smoker   . Smokeless tobacco: Never Used  . Alcohol Use: No     Comment: occasional use, last used 02/03/2011   OB History   Grav Para Term Preterm Abortions TAB SAB Ect Mult Living   6 3   2           Review of Systems  Gastrointestinal: Positive for abdominal pain.  Genitourinary: Negative for vaginal bleeding.  All other systems reviewed and are negative.  Allergies  Review of patient's allergies indicates no known allergies.  Home Medications   Prior to Admission medications   Medication Sig Start Date End Date Taking? Authorizing Provider  HYDROcodone-acetaminophen (NORCO/VICODIN) 5-325 MG per tablet Take 1-2 tablets by mouth every 6 (six) hours as  needed. 04/09/13   Gwyneth Sprout, Alexandria Frey  Norgestimate-Eth Estradiol (SPRINTEC 28 PO) Take by mouth daily.    Historical Provider, Alexandria Frey   Triage Vitals: BP 90/53  Pulse 70  Temp(Src) 98.4 F (36.9 C)  Resp 16  Ht 5\' 6"  (1.676 m)  Wt 160 lb (72.576 kg)  BMI 25.84 kg/m2  SpO2 100%  LMP 11/13/2013 Physical Exam  Nursing note and vitals reviewed. Constitutional: She is oriented to person, place, and time. She appears well-developed and well-nourished.  HENT:  Head: Normocephalic and atraumatic.  Eyes: Conjunctivae and EOM are normal. Pupils are equal, round, and reactive to light.  Neck: Normal range of motion. Neck supple.  Cardiovascular: Normal rate and regular rhythm.   Pulmonary/Chest: Effort normal and breath sounds normal.  Abdominal: Soft. Bowel sounds are normal. There is no tenderness. There is no rebound and no guarding.  Genitourinary: Cervix exhibits discharge. Cervix exhibits no motion tenderness and no friability. Right adnexum displays no mass, no tenderness and no fullness. Left adnexum displays no mass, no tenderness and no fullness. Vaginal discharge found.  Cervical os is closed No bleeding White discharge Uterus enlarged, 8-9 weeks size No adnexa masses  Musculoskeletal: Normal range of motion.  Neurological: She is alert and oriented to person, place, and time.  Skin: Skin is warm and dry.  Psychiatric: She has a normal mood and affect.  Her behavior is normal.   ED Course  Procedures (including critical care time) DIAGNOSTIC STUDIES: Oxygen Saturation is 100% on RA, normal by my interpretation.    COORDINATION OF CARE: 12:44 AM-Discussed treatment plan which includes UA and pelvic exam with pt at bedside and pt agreed to plan.   Labs Review Labs Reviewed  URINALYSIS, ROUTINE W REFLEX MICROSCOPIC - Abnormal; Notable for the following:    Specific Gravity, Urine 1.036 (*)    Ketones, ur 15 (*)    All other components within normal limits  PREGNANCY, URINE -  Abnormal; Notable for the following:    Preg Test, Ur POSITIVE (*)    All other components within normal limits   Imaging Review No results found.   EKG Interpretation None      MDM   Final diagnoses:  None    Pregnancy: given irregular period history there is a strong possibility this patient is further along than 7 weeks.  She has had no bleeding no weakness.  No focal abdominal pain.  Follow up with the am with your OB for ongoing care.  Patient verbalizes understanding and agrees to follow up  I personally performed the services described in this documentation, which was scribed in my presence. The recorded information has been reviewed and is accurate.    Alexandria AweApril K Jessey Stehlin-Rasch, Alexandria Frey 12/23/13 [phone]

## 2013-12-24 LAB — GC/CHLAMYDIA PROBE AMP
CT PROBE, AMP APTIMA: NEGATIVE
GC PROBE AMP APTIMA: NEGATIVE

## 2014-01-22 ENCOUNTER — Encounter (HOSPITAL_BASED_OUTPATIENT_CLINIC_OR_DEPARTMENT_OTHER): Payer: Self-pay | Admitting: Emergency Medicine

## 2014-01-22 ENCOUNTER — Emergency Department (HOSPITAL_BASED_OUTPATIENT_CLINIC_OR_DEPARTMENT_OTHER)
Admission: EM | Admit: 2014-01-22 | Discharge: 2014-01-22 | Disposition: A | Discharge status: Home / Self Care | Payer: Medicaid Other | Attending: Emergency Medicine | Admitting: Emergency Medicine

## 2014-01-22 DIAGNOSIS — R300 Dysuria: Secondary | ICD-10-CM | POA: Insufficient documentation

## 2014-01-22 DIAGNOSIS — R197 Diarrhea, unspecified: Secondary | ICD-10-CM | POA: Insufficient documentation

## 2014-01-22 DIAGNOSIS — Z3202 Encounter for pregnancy test, result negative: Secondary | ICD-10-CM | POA: Insufficient documentation

## 2014-01-22 DIAGNOSIS — R509 Fever, unspecified: Secondary | ICD-10-CM | POA: Insufficient documentation

## 2014-01-22 DIAGNOSIS — R1110 Vomiting, unspecified: Secondary | ICD-10-CM

## 2014-01-22 DIAGNOSIS — Z79899 Other long term (current) drug therapy: Secondary | ICD-10-CM | POA: Insufficient documentation

## 2014-01-22 DIAGNOSIS — R112 Nausea with vomiting, unspecified: Secondary | ICD-10-CM | POA: Insufficient documentation

## 2014-01-22 LAB — URINALYSIS, ROUTINE W REFLEX MICROSCOPIC
Glucose, UA: NEGATIVE mg/dL
HGB URINE DIPSTICK: NEGATIVE
KETONES UR: 40 mg/dL — AB
LEUKOCYTES UA: NEGATIVE
Nitrite: NEGATIVE
PROTEIN: NEGATIVE mg/dL
Specific Gravity, Urine: 1.029 (ref 1.005–1.030)
Urobilinogen, UA: 1 mg/dL (ref 0.0–1.0)
pH: 5.5 (ref 5.0–8.0)

## 2014-01-22 LAB — PREGNANCY, URINE: PREG TEST UR: NEGATIVE

## 2014-01-22 MED ORDER — ONDANSETRON 4 MG PO TBDP
4.0000 mg | ORAL_TABLET | Freq: Once | ORAL | Status: AC
  Administered 2014-01-22: 4 mg via ORAL
  Filled 2014-01-22: qty 1

## 2014-01-22 NOTE — ED Notes (Signed)
Pt reports n/v/d and fever that began today - states she is unsure of actual temperature however states "I just know I have one" - has not taken any medications today.

## 2014-01-22 NOTE — ED Notes (Signed)
EDPA at patient bedside for assessment/update

## 2014-01-22 NOTE — ED Provider Notes (Signed)
Medical screening examination/treatment/procedure(s) were performed by non-physician practitioner and as supervising physician I was immediately available for consultation/collaboration.    Linwood Dibbles, MD 01/22/14 2136

## 2014-01-22 NOTE — Discharge Instructions (Signed)

## 2014-01-22 NOTE — ED Provider Notes (Signed)
CSN: [id number]     Arrival date & time 01/22/14  2101 History   First MD Initiated Contact with Patient 01/22/14 2108     Chief Complaint  Patient presents with  . Fever     (Consider location/radiation/quality/duration/timing/severity/associated sxs/prior Treatment) Patient is a 23 y.o. female presenting with fever. The history is provided by the patient. No language interpreter was used.  Fever Temp source:  Subjective Severity:  Mild Onset quality:  Gradual Duration:  1 day Timing:  Constant Progression:  Worsening Chronicity:  New Relieved by:  Nothing Worsened by:  Nothing tried Ineffective treatments:  None tried Associated symptoms: diarrhea, dysuria, nausea and vomiting   Associated symptoms: no rhinorrhea   Risk factors: no recent surgery   Pt reports vomitting and diarrhea yesterday and today.   Pt reports drinking and eating now.  Pt is till nauseated.  History reviewed. No pertinent past medical history. Past Surgical History  Procedure Laterality Date  . Cesarean section    . Iud removal     Family History  Problem Relation Age of Onset  . Sudden death Neg Hx   . Hypertension Neg Hx   . Hyperlipidemia Neg Hx   . Heart attack Neg Hx   . Diabetes Neg Hx    History  Substance Use Topics  . Smoking status: Never Smoker   . Smokeless tobacco: Never Used  . Alcohol Use: Yes     Comment: occasional use   OB History   Grav Para Term Preterm Abortions TAB SAB Ect Mult Living   Review of Systems  Constitutional: Positive for fever.  HENT: Negative for rhinorrhea.   Gastrointestinal: Positive for nausea, vomiting and diarrhea.  Genitourinary: Positive for dysuria.  All other systems reviewed and are negative.     Allergies  Review of patient's allergies indicates no known allergies.  Home Medications   Prior to Admission medications   Medication Sig Start Date End Date Taking? Authorizing Provider  HYDROcodone-acetaminophen  (NORCO/VICODIN) 5-325 MG per tablet Take 1-2 tablets by mouth every 6 (six) hours as needed. 04/09/13   Gwyneth Sprout, MD  Norgestimate-Eth Estradiol (SPRINTEC 28 PO) Take by mouth daily.    Historical Provider, MD   BP 105/61  Pulse 98  Temp(Src) 98.2 F (36.8 C) (Oral)  Resp 18  Ht  (1.676 m)  Wt 170 lb (77.111 kg)  BMI 27.45 kg/m2  SpO2 100%  LMP 12/28/2013 Physical Exam  Nursing note and vitals reviewed. Constitutional: She is oriented to person, place, and time. She appears well-developed and well-nourished.  HENT:  Head: Normocephalic.  Eyes: EOM are normal. Pupils are equal, round, and reactive to light.  Neck: Normal range of motion.  Cardiovascular: Normal rate.   Pulmonary/Chest: Effort normal.  Abdominal: Soft. She exhibits no distension.  Musculoskeletal: Normal range of motion.  Neurological: She is alert and oriented to person, place, and time.  Skin: Skin is warm.  Psychiatric: She has a normal mood and affect.    ED Course  Procedures (including critical care time) Labs Review Labs Reviewed  URINALYSIS, ROUTINE W REFLEX MICROSCOPIC - Abnormal; Notable for the following:    Bilirubin Urine SMALL (*)    Ketones, ur 40 (*)    All other components within normal limits  PREGNANCY, URINE    Imaging Review No results found.   EKG Interpretation None      MDM zofran odt.  Pt tolerating po fluids.  ua shows 40 ketones   Final diagnoses:  Vomiting and diarrhea        Elson Areas, PA-C 01/22/14 2134

## 2014-03-09 ENCOUNTER — Encounter (HOSPITAL_BASED_OUTPATIENT_CLINIC_OR_DEPARTMENT_OTHER): Payer: Self-pay | Admitting: Emergency Medicine

## 2014-05-26 ENCOUNTER — Emergency Department (HOSPITAL_BASED_OUTPATIENT_CLINIC_OR_DEPARTMENT_OTHER)
Admission: EM | Admit: 2014-05-26 | Discharge: 2014-05-26 | Disposition: A | Discharge status: Home / Self Care | Payer: Medicaid Other | Attending: Emergency Medicine | Admitting: Emergency Medicine

## 2014-05-26 ENCOUNTER — Encounter (HOSPITAL_BASED_OUTPATIENT_CLINIC_OR_DEPARTMENT_OTHER): Payer: Self-pay | Admitting: Emergency Medicine

## 2014-05-26 DIAGNOSIS — R531 Weakness: Secondary | ICD-10-CM | POA: Insufficient documentation

## 2014-05-26 DIAGNOSIS — J029 Acute pharyngitis, unspecified: Secondary | ICD-10-CM | POA: Insufficient documentation

## 2014-05-26 DIAGNOSIS — R110 Nausea: Secondary | ICD-10-CM | POA: Insufficient documentation

## 2014-05-26 DIAGNOSIS — Z79899 Other long term (current) drug therapy: Secondary | ICD-10-CM | POA: Insufficient documentation

## 2014-05-26 MED ORDER — CEPHALEXIN 500 MG PO CAPS: 500.0000 mg | ORAL_CAPSULE | Freq: Three times a day (TID) | ORAL | Status: AC

## 2014-05-26 MED ORDER — CEPHALEXIN 250 MG PO CAPS
500.0000 mg | ORAL_CAPSULE | Freq: Once | ORAL | Status: AC
  Administered 2014-05-26: 500 mg via ORAL
  Filled 2014-05-26: qty 2

## 2014-05-26 NOTE — ED Notes (Signed)
C/o sore throat X3d, no V/D, ? Fever, weakness, A/OX4, ambulatory and in NAD

## 2014-05-26 NOTE — Discharge Instructions (Signed)
Sore throat can be from a bacteria (strep) and can go away with antibiotic (keflex). Viruses can also cause sore throats with white ulcers (like the one in your throat). The only treatment for this is tylenol, and time.    Pharyngitis Pharyngitis is a sore throat (pharynx). There is redness, pain, and swelling of your throat. HOME CARE   Drink enough fluids to keep your pee (urine) clear or pale yellow.  Only take medicine as told by your doctor.  You may get sick again if you do not take medicine as told. Finish your medicines, even if you start to feel better.  Do not take aspirin.  Rest.  Rinse your mouth (gargle) with salt water ( tsp of salt per 1 qt of water) every 1-2 hours. This will help the pain.  If you are not at risk for choking, you can suck on hard candy or sore throat lozenges. GET HELP IF:  You have large, tender lumps on your neck.  You have a rash.  You cough up green, yellow-brown, or bloody spit. GET HELP RIGHT AWAY IF:   You have a stiff neck.  You drool or cannot swallow liquids.  You throw up (vomit) or are not able to keep medicine or liquids down.  You have very bad pain that does not go away with medicine.  You have problems breathing (not from a stuffy nose). MAKE SURE YOU:   Understand these instructions.  Will watch your condition.  Will get help right away if you are not doing well or get worse. Document Released: 10/11/2007 Document Revised: 02/12/2013 Document Reviewed: 12/30/2012 [facility] Patient Information 2015 [employer], [place]. This information is not intended to replace advice given to you by your health care provider. Make sure you discuss any questions you have with your health care provider.

## 2014-05-26 NOTE — ED Notes (Signed)
Sore to rt side of throat x 3 days weakness

## 2014-05-26 NOTE — ED Provider Notes (Signed)
CSN: [id number]     Arrival date & time 05/26/14  1846 History   This chart was scribed for Rolland PorterMark Vickye Astorino, MD by Evon Slackerrance Branch, ED Scribe. This patient was seen in room MH05/MH05 and the patient's care was started at 7:00 PM.     Chief Complaint  Patient presents with  . Sore Throat   Patient is a 24 y.o. female presenting with pharyngitis. The history is provided by the patient. No language interpreter was used.  Sore Throat Pertinent negatives include no chest pain, no abdominal pain, no headaches and no shortness of breath.   HPI Comments: Skip MayerLetalya Dehn is a 24 y.o. female who presents to the Emergency Department complaining of right sided sore throat onset 3 days prior. Pt states she has subjective fever, nausea and weakness. Pt states that she has recently been around her brother who has recently been diagnosed with strep throat. Pt denies vomiting or other related symptoms.   History reviewed. No pertinent past medical history. Past Surgical History  Procedure Laterality Date  . Cesarean section    . Iud removal     Family History  Problem Relation Age of Onset  . Sudden death Neg Hx   . Hypertension Neg Hx   . Hyperlipidemia Neg Hx   . Heart attack Neg Hx   . Diabetes Neg Hx    History  Substance Use Topics  . Smoking status: Never Smoker   . Smokeless tobacco: Never Used  . Alcohol Use: No     Comment: occasional use   OB History    Gravida Para Term Preterm AB TAB SAB Ectopic Multiple Living   6 3   2            Review of Systems  Constitutional: Negative for fever, chills, diaphoresis, appetite change and fatigue.  HENT: Positive for sore throat. Negative for mouth sores and trouble swallowing.   Eyes: Negative for visual disturbance.  Respiratory: Negative for cough, chest tightness, shortness of breath and wheezing.   Cardiovascular: Negative for chest pain.  Gastrointestinal: Positive for nausea. Negative for vomiting, abdominal pain, diarrhea and abdominal  distention.  Endocrine: Negative for polydipsia, polyphagia and polyuria.  Genitourinary: Negative for dysuria, frequency and hematuria.  Musculoskeletal: Negative for gait problem.  Skin: Negative for color change, pallor and rash.  Neurological: Positive for weakness. Negative for dizziness, syncope, light-headedness and headaches.  Hematological: Does not bruise/bleed easily.  Psychiatric/Behavioral: Negative for behavioral problems and confusion.     Allergies  Review of patient's allergies indicates no known allergies.  Home Medications   Prior to Admission medications   Medication Sig Start Date End Date Taking? Authorizing Provider  cephALEXin (KEFLEX) 500 MG capsule Take 1 capsule (500 mg total) by mouth 3 (three) times daily. 05/26/14 06/02/14  Rolland PorterMark Marnita Poirier, MD  HYDROcodone-acetaminophen (NORCO/VICODIN) 5-325 MG per tablet Take 1-2 tablets by mouth every 6 (six) hours as needed. 04/09/13   Gwyneth SproutWhitney Plunkett, MD  Norgestimate-Eth Estradiol (SPRINTEC 28 PO) Take by mouth daily.    Historical Provider, MD   BP 113/59 mmHg  Pulse 66  Temp(Src) 97.8 F (36.6 C) (Oral)  Resp 18  Ht 5\' 6"  (1.676 m)  Wt 156 lb (70.761 kg)  BMI 25.19 kg/m2  SpO2 100%  LMP 05/23/2014   Physical Exam  Constitutional: She is oriented to person, place, and time. She appears well-developed and well-nourished. No distress.  HENT:  Head: Normocephalic.  Mouth/Throat: Posterior oropharyngeal erythema present.  Erythema to right tonsil base.  Eyes: Conjunctivae are normal. Pupils are equal, round, and reactive to light. No scleral icterus.  Neck: Normal range of motion. Neck supple. No thyromegaly present.  Cardiovascular: Normal rate and regular rhythm.  Exam reveals no gallop and no friction rub.   No murmur heard. Pulmonary/Chest: Effort normal and breath sounds normal. No respiratory distress. She has no wheezes. She has no rales.  Abdominal: Soft. Bowel sounds are normal. She exhibits no  distension. There is no tenderness. There is no rebound.  Musculoskeletal: Normal range of motion.  Neurological: She is alert and oriented to person, place, and time.  Skin: Skin is warm and dry. No rash noted.  Psychiatric: She has a normal mood and affect. Her behavior is normal.  Nursing note and vitals reviewed.   ED Course  Procedures (including critical care time) DIAGNOSTIC STUDIES: Oxygen Saturation is 100% on RA, normal by my interpretation.    COORDINATION OF CARE: 7:15 PM-Discussed treatment plan with pt at bedside and pt agreed to plan.     Labs Review Labs Reviewed - No data to display  Imaging Review No results found.   EKG Interpretation None      MDM   Final diagnoses:  Pharyngitis           Rolland Porter, MD 05/30/14 [phone]

## 2014-06-08 ENCOUNTER — Emergency Department (HOSPITAL_BASED_OUTPATIENT_CLINIC_OR_DEPARTMENT_OTHER)
Admission: EM | Admit: 2014-06-08 | Discharge: 2014-06-08 | Disposition: A | Discharge status: Home / Self Care | Payer: Medicaid Other | Attending: Emergency Medicine | Admitting: Emergency Medicine

## 2014-06-08 ENCOUNTER — Encounter (HOSPITAL_BASED_OUTPATIENT_CLINIC_OR_DEPARTMENT_OTHER): Payer: Self-pay

## 2014-06-08 DIAGNOSIS — Z79899 Other long term (current) drug therapy: Secondary | ICD-10-CM | POA: Insufficient documentation

## 2014-06-08 DIAGNOSIS — R062 Wheezing: Secondary | ICD-10-CM

## 2014-06-08 DIAGNOSIS — J069 Acute upper respiratory infection, unspecified: Secondary | ICD-10-CM | POA: Insufficient documentation

## 2014-06-08 MED ORDER — PREDNISONE 20 MG PO TABS: ORAL_TABLET | ORAL | Status: AC

## 2014-06-08 MED ORDER — ALBUTEROL SULFATE HFA 108 (90 BASE) MCG/ACT IN AERS
1.0000 | INHALATION_SPRAY | Freq: Four times a day (QID) | RESPIRATORY_TRACT | Status: DC | PRN
Start: 2014-06-08 — End: 2014-06-08

## 2014-06-08 MED ORDER — PREDNISONE 10 MG PO TABS: ORAL_TABLET | ORAL | Status: DC

## 2014-06-08 MED ORDER — ALBUTEROL SULFATE (2.5 MG/3ML) 0.083% IN NEBU
5.0000 mg | INHALATION_SOLUTION | Freq: Once | RESPIRATORY_TRACT | Status: AC
  Administered 2014-06-08: 5 mg via RESPIRATORY_TRACT
  Filled 2014-06-08: qty 6

## 2014-06-08 MED ORDER — LORATADINE 10 MG PO TABS
10.0000 mg | ORAL_TABLET | Freq: Once | ORAL | Status: AC
  Administered 2014-06-08: 10 mg via ORAL
  Filled 2014-06-08: qty 1

## 2014-06-08 MED ORDER — ALBUTEROL SULFATE HFA 108 (90 BASE) MCG/ACT IN AERS: 1.0000 | INHALATION_SPRAY | Freq: Four times a day (QID) | RESPIRATORY_TRACT | Status: AC | PRN

## 2014-06-08 MED ORDER — LORATADINE 10 MG PO TABS: 10.0000 mg | ORAL_TABLET | Freq: Every day | ORAL | Status: AC

## 2014-06-08 MED ORDER — PREDNISONE 50 MG PO TABS
60.0000 mg | ORAL_TABLET | Freq: Once | ORAL | Status: AC
  Administered 2014-06-08: 60 mg via ORAL
  Filled 2014-06-08 (×2): qty 1

## 2014-06-08 MED ORDER — LORATADINE 10 MG PO TABS: 10.0000 mg | ORAL_TABLET | Freq: Every day | ORAL | Status: DC

## 2014-06-08 NOTE — ED Notes (Signed)
MD at bedside. 

## 2014-06-08 NOTE — Discharge Instructions (Signed)
Cough, Adult  A cough is a reflex that helps clear your throat and airways. It can help heal the body or may be a reaction to an irritated airway. A cough may only last 2 or 3 weeks (acute) or may last more than 8 weeks (chronic).  CAUSES Acute cough:  Viral or bacterial infections. Chronic cough:  Infections.  Allergies.  Asthma.  Post-nasal drip.  Smoking.  Heartburn or acid reflux.  Some medicines.  Chronic lung problems (COPD).  Cancer. SYMPTOMS   Cough.  Fever.  Chest pain.  Increased breathing rate.  High-pitched whistling sound when breathing (wheezing).  Colored mucus that you cough up (sputum). TREATMENT   A bacterial cough may be treated with antibiotic medicine.  A viral cough must run its course and will not respond to antibiotics.  Your caregiver may recommend other treatments if you have a chronic cough. HOME CARE INSTRUCTIONS   Only take over-the-counter or prescription medicines for pain, discomfort, or fever as directed by your caregiver. Use cough suppressants only as directed by your caregiver.  Use a cold steam vaporizer or humidifier in your bedroom or home to help loosen secretions.  Sleep in a semi-upright position if your cough is worse at night.  Rest as needed.  Stop smoking if you smoke. SEEK IMMEDIATE MEDICAL CARE IF:   You have pus in your sputum.  Your cough starts to worsen.  You cannot control your cough with suppressants and are losing sleep.  You begin coughing up blood.  You have difficulty breathing.  You develop pain which is getting worse or is uncontrolled with medicine.  You have a fever. MAKE SURE YOU:   Understand these instructions.  Will watch your condition.  Will get help right away if you are not doing well or get worse. Document Released: 10/21/2010 Document Revised: 07/17/2011 Document Reviewed: 10/21/2010 [employer] Patient Information 2015 [employer]. This information is not intended  to replace advice given to you by your health care provider. Make sure you discuss any questions you have with your health care provider.  

## 2014-06-08 NOTE — ED Notes (Signed)
Reports shortness of breath and wheezing x 2 days.

## 2014-06-08 NOTE — ED Provider Notes (Signed)
CSN: [id number]     Arrival date & time 06/08/14  0828 History   First MD Initiated Contact with Patient 06/08/14 0831     Chief Complaint  Patient presents with  . Wheezing     (Consider location/radiation/quality/duration/timing/severity/associated sxs/prior Treatment) Patient is a 24 y.o. female presenting with wheezing. The history is provided by the patient.  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 No pertinent past medical history. Past Surgical History  Procedure Laterality Date  . Cesarean section    . Iud removal     Family History  Problem Relation Age of Onset  . Sudden death Neg Hx   . Hypertension Neg Hx   . Hyperlipidemia Neg Hx   . Heart attack Neg Hx   . Diabetes Neg Hx    History  Substance Use Topics  . Smoking status: Never Smoker   . Smokeless tobacco: Never Used  . Alcohol Use: No     Comment: occasional use   OB History    Gravida Para Term Preterm AB TAB SAB Ectopic Multiple Living   6 3   2           Review of Systems  Constitutional: Negative for fever and fatigue.  HENT: Negative for congestion and drooling.   Eyes: Negative for pain.  Respiratory: Positive for cough, shortness of breath and wheezing.   Cardiovascular: Negative for chest pain.  Gastrointestinal: Negative for nausea, vomiting, abdominal pain and diarrhea.  Genitourinary: Negative for dysuria and hematuria.  Musculoskeletal: Negative for back pain, gait problem and neck pain.  Skin:  Negative for color change.  Neurological: Negative for dizziness and headaches.  Hematological: Negative for adenopathy.  Psychiatric/Behavioral: Negative for behavioral problems.  All other systems reviewed and are negative.     Allergies  Review of patient's allergies indicates no known allergies.  Home Medications   Prior to Admission medications   Medication Sig Start Date End Date Taking? Authorizing Provider  HYDROcodone-acetaminophen (NORCO/VICODIN) 5-325 MG per tablet Take 1-2 tablets by mouth every 6 (six) hours as needed. 04/09/13   Gwyneth SproutWhitney Plunkett, MD  Norgestimate-Eth Estradiol (SPRINTEC 28 PO) Take by mouth daily.    Historical Provider, MD   BP 97/66 mmHg  Pulse 75  Temp(Src) 98.4 F (36.9 C) (Oral)  Resp 20  Ht 5\' 6"  (1.676 m)  Wt 156 lb (70.761 kg)  BMI 25.19 kg/m2  SpO2 100%  LMP 05/23/2014 Physical Exam  Constitutional: She is oriented to person, place, and time. She appears well-developed and well-nourished.  HENT:  Head: Normocephalic.  Mouth/Throat: Oropharynx is clear and moist. No oropharyngeal exudate.  Eyes: Conjunctivae and EOM are normal. Pupils are equal, round, and reactive to light.  Neck: Normal range of motion. Neck supple.  Cardiovascular: Normal rate, regular rhythm, normal heart sounds and intact distal pulses.  Exam reveals no gallop and no friction rub.   No murmur heard. Pulmonary/Chest: Effort normal. No respiratory distress. She has wheezes (mild exophoria wheeze heard in the right upper lobe.).  Abdominal:  Soft. Bowel sounds are normal. There is no tenderness. There is no rebound and no guarding.  Musculoskeletal: Normal range of motion. She exhibits no edema or tenderness.  Neurological: She is alert and oriented to person, place, and time.  Skin: Skin is warm and dry.  Psychiatric: She has a normal mood and affect. Her behavior is normal.  Nursing note and vitals reviewed.   ED Course  Procedures (including critical care  time) Labs Review Labs Reviewed - No data to display  Imaging Review No results found.   EKG Interpretation None      MDM   Final diagnoses:  Wheezing  URI (upper respiratory infection)    8:46 AM 24 y.o. female who presents with shortness of breath. She notes it developed over the last few days and seems to be worse at night. She has a history of wheezing with exercise in the past and has used an inhaler intermittently throughout her life. She denies any fevers but has had a cough productive of white sputum. She also notes some intermittent itching and believes that she is allergic to something, possibly something and sugary sodas. She is afebrile and vital signs are unremarkable here. She has a faint expiratory wheeze heard in the right upper lung. I suspect her symptoms are due to some component of reactive airway disease given that she has had similar symptoms in the past. Possibly exacerbated by weather changes and a URI. Will give an albuterol neb, Claritin, and a dose of prednisone.  10:05 AM: Patient feeling better after breathing treatment. Will prescribe prednisone for several days and an inhaler. I have discussed the diagnosis/risks/treatment options with the patient and believe the pt to be eligible for discharge home to follow-up with her pcp. We also discussed returning to the ED immediately if new or worsening sx occur. We discussed the sx which are most concerning (e.g., worsening sob, fever, cp) that necessitate immediate return. Medications administered to the patient during their visit and any new prescriptions provided to the patient are listed below.  Medications given during this visit Medications  predniSONE (DELTASONE) tablet 60 mg (60 mg Oral Given 06/08/14 0854)  loratadine (CLARITIN) tablet 10 mg (10 mg Oral Given 06/08/14 0854)  albuterol (PROVENTIL) (2.5 MG/3ML) 0.083% nebulizer solution 5 mg (5 mg Nebulization Given 06/08/14 0856)    New Prescriptions    ALBUTEROL (PROVENTIL HFA;VENTOLIN HFA) 108 (90 BASE) MCG/ACT INHALER    Inhale 1-2 puffs into the lungs every 6 (six) hours as needed for wheezing or shortness of breath.   LORATADINE (CLARITIN) 10 MG TABLET    Take 1 tablet (10 mg total) by mouth daily.   PREDNISONE (DELTASONE) 10 MG TABLET    Take 2 tablets by mouth on day 1 and 2. Take 1 tablet by mouth on day 3.     Purvis Sheffield, MD 06/08/14 1006

## 2015-05-21 IMAGING — CR DG KNEE COMPLETE 4+V*R*
4 series · 4 of 4 positions shown · non-contrast
Comparison: None.

CLINICAL DATA: Four days of anterior knee pain.  No injury.

EXAM:
RIGHT KNEE - COMPLETE 4+ VIEW

[t knee ap right]
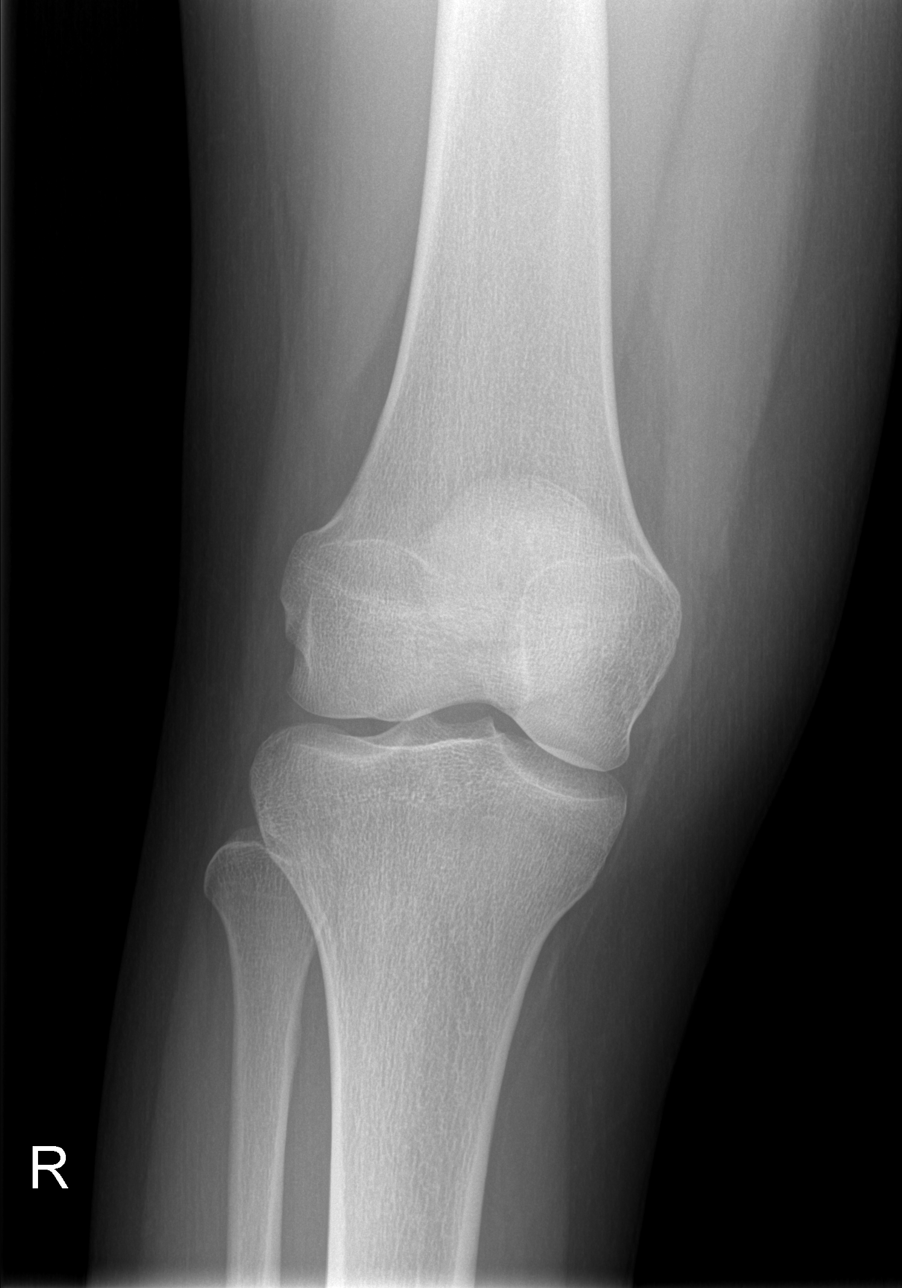

[t knee oblique right (1 of 2)]
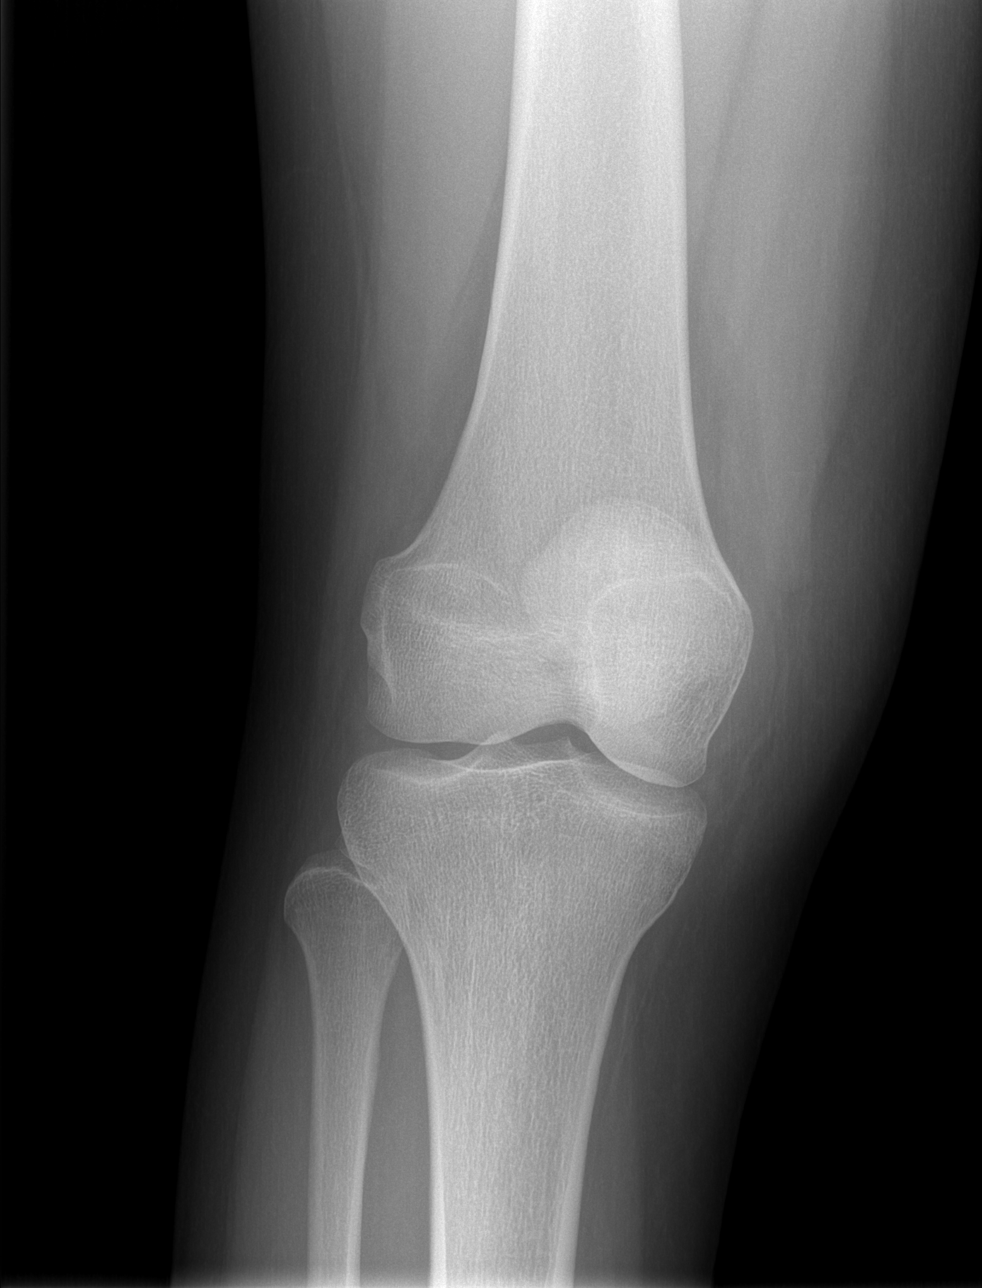

[t knee oblique right (2 of 2)]
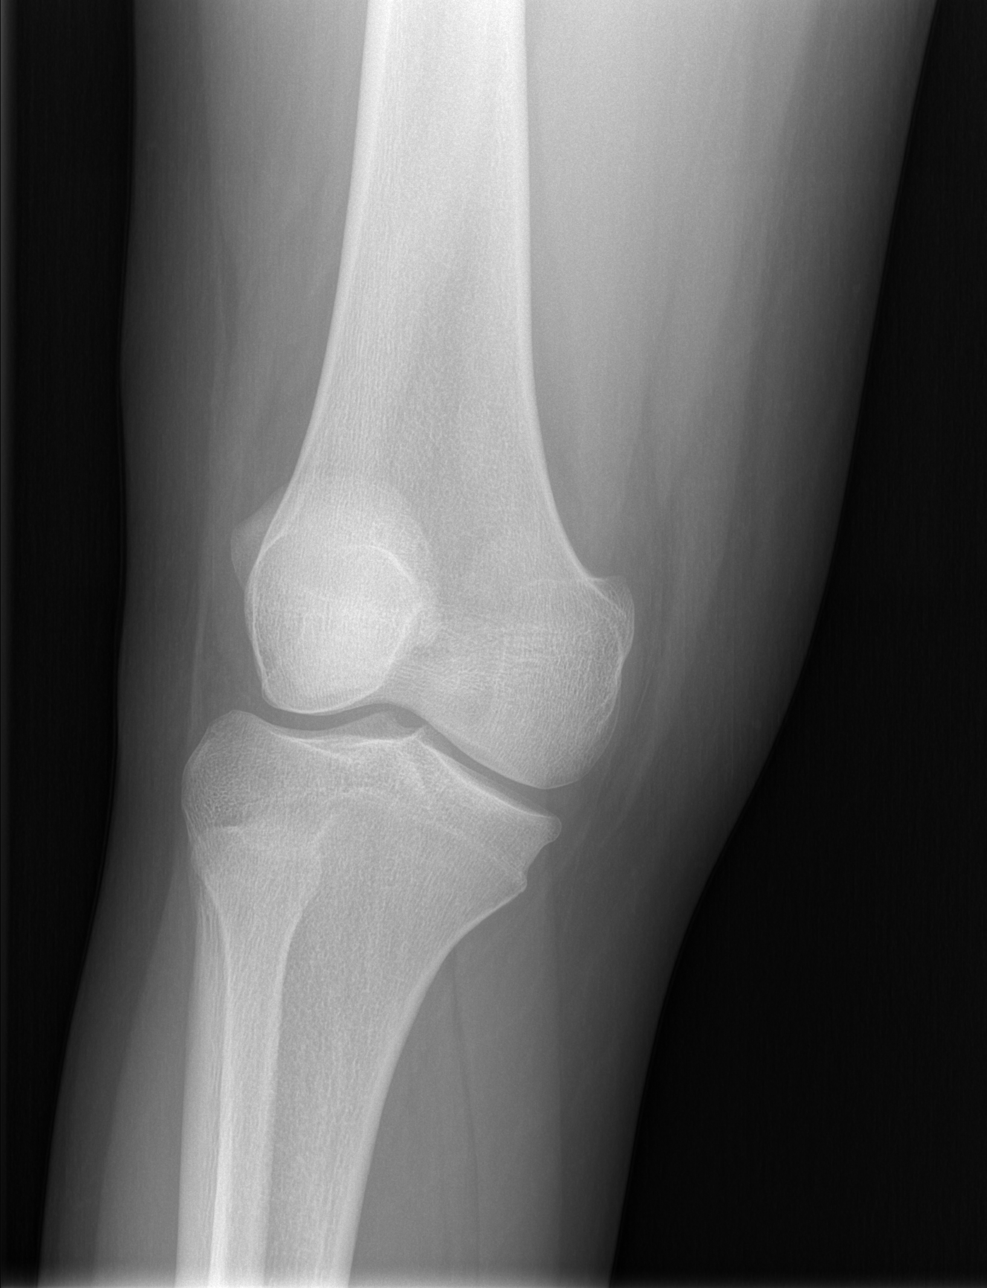

[t knee lat right]
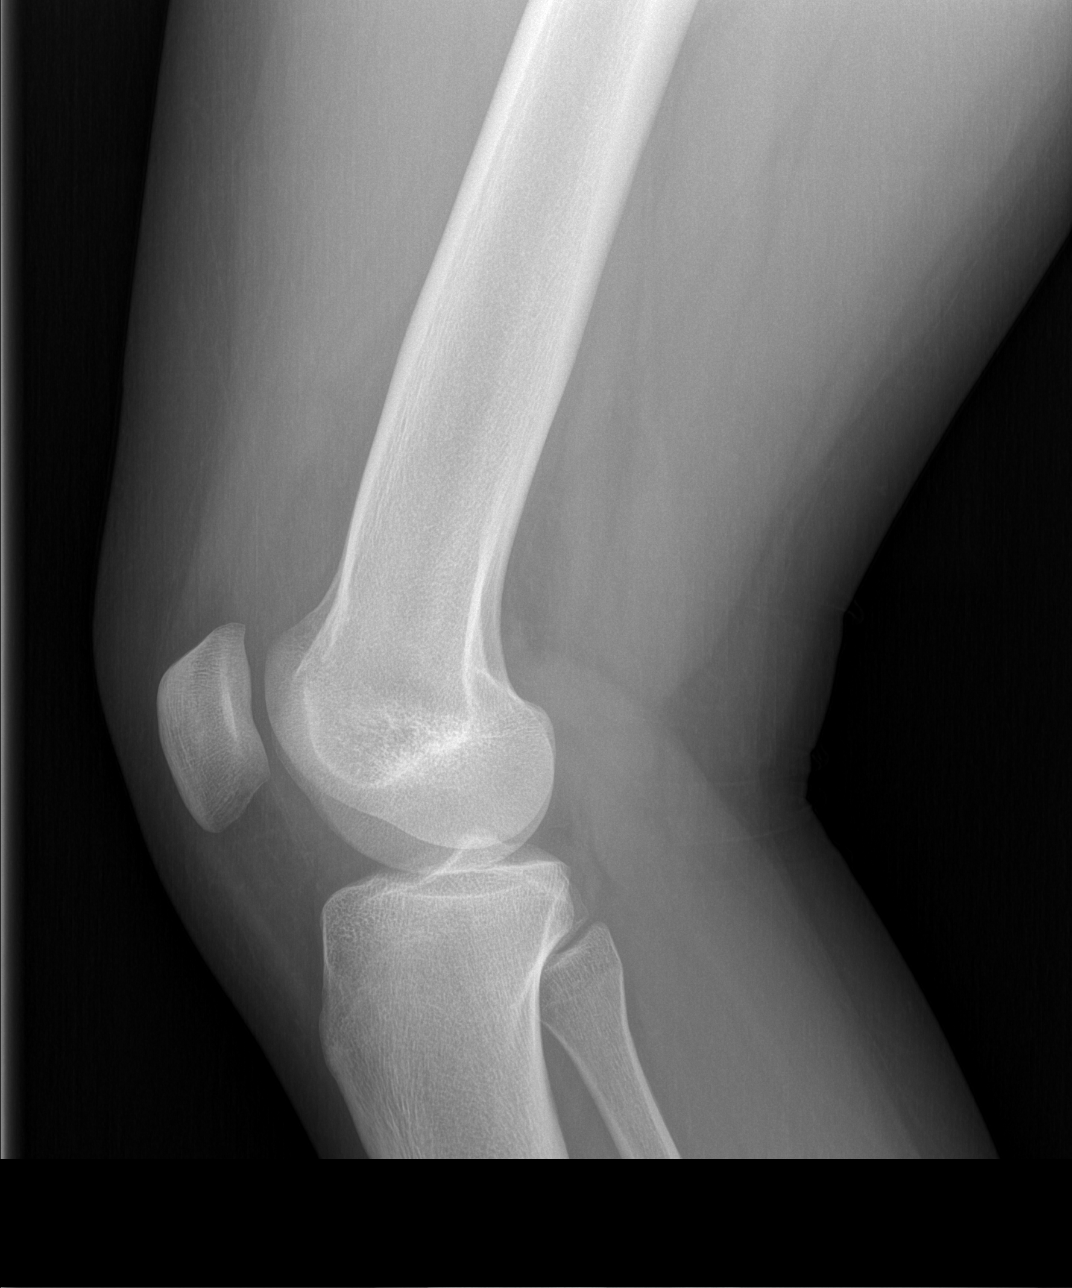

[4 of 4 positions shown; findings below may reference images not displayed]

FINDINGS: There is no evidence of fracture, dislocation, or joint effusion.
There is no evidence of arthropathy or other focal bone abnormality.
Soft tissues are unremarkable.
IMPRESSION: Negative.
